# Patient Record
Sex: Female | Born: 1995 | Race: Black or African American | Hispanic: No | Marital: Single | Smoking: Never smoker
Health system: Southern US, Community
[De-identification: ages and names within clinical notes are randomized; demographics above are authoritative.]

## PROBLEM LIST (undated history)

## (undated) DIAGNOSIS — J45909 Unspecified asthma, uncomplicated: Secondary | ICD-10-CM

## (undated) DIAGNOSIS — J302 Other seasonal allergic rhinitis: Secondary | ICD-10-CM

## (undated) DIAGNOSIS — Z9889 Other specified postprocedural states: Secondary | ICD-10-CM

## (undated) HISTORY — PX: WISDOM TOOTH EXTRACTION: SHX21

---
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
## 2017-01-31 ENCOUNTER — Ambulatory Visit (INDEPENDENT_AMBULATORY_CARE_PROVIDER_SITE_OTHER): Payer: Commercial Managed Care - PPO | Admitting: Orthopedic Surgery

## 2017-01-31 ENCOUNTER — Encounter (INDEPENDENT_AMBULATORY_CARE_PROVIDER_SITE_OTHER): Payer: Self-pay | Admitting: Orthopedic Surgery

## 2017-01-31 DIAGNOSIS — M25571 Pain in right ankle and joints of right foot: Secondary | ICD-10-CM | POA: Diagnosis not present

## 2017-01-31 MED ORDER — DICLOFENAC SODIUM 2 % TD SOLN: 2.0000 | Freq: Two times a day (BID) | TRANSDERMAL | 1 refills | Status: DC | PRN

## 2017-01-31 NOTE — Progress Notes (Signed)
Office Visit Note   Patient: Abigail Wolfe           Date of Birth: March 20, 1995           MRN: [id number] Visit Date: 01/31/2017 Requested by: Kelton Pillar, MD [facility] [employer] [address] [place], [place] [address] PCP: Kelton Pillar, MD  Subjective: Chief Complaint  Patient presents with  . Right Foot - Pain    HPI: Patient is a 21 year old with right posterior ankle pain.  4 months ago she had superficial peroneal nerve decompression and she is doing well with that.  The sensation is gradually returning to the dorsal aspect of her foot.  No she describes 6 week history of posterior ankle pain.  No definite history of injury.  She feels like there is a pulling sensation in the tendons.  This is a different pain than she had prior to the ankle surgery.  She takes occasional Advil for the problem.              ROS: All systems reviewed are negative as they relate to the chief complaint within the history of present illness.  Patient denies  fevers or chills.   Assessment & Plan: Visit Diagnoses:  1. Pain in right ankle and joints of right foot     Plan: Impression is soft tissue mediated inflammatory pain posterior lateral right ankle.  Plan is topical anti-inflammatory for 6 weeks.  Could consider further imaging if needed.  Reviewed the MRI scan from May and that was normal of anatomic structures in that area.  Follow-Up Instructions: Return if symptoms worsen or fail to improve.   Orders:  No orders of the defined types were placed in this encounter.  Meds ordered this encounter  Medications  . Diclofenac Sodium (PENNSAID) 2 % SOLN    Sig: Place 2 Squirts onto the skin 2 (two) times daily as needed.    Dispense:  1 Bottle    Refill:  1      Procedures: No procedures performed   Clinical Data: No additional findings.  Objective: Vital Signs: There were no vitals taken for this visit.  Physical Exam:   Constitutional: Patient appears well-developed HEENT:    Head: Normocephalic Eyes:EOM are normal Neck: Normal range of motion Cardiovascular: Normal rate Pulmonary/chest: Effort normal Neurologic: Patient is alert Skin: Skin is warm Psychiatric: Patient has normal mood and affect    Ortho Exam: Orthopedic exam demonstrates normal gait alignment well-healed surgical incision over the lateral aspect of the lower left leg.  Negative Tinel's in this region.  She has good ankle dorsi and plantar flexion inversion and eversion strength.  Most of her tenderness seems to localize in the posterior lateral aspect of the ankle just below the hind the perineal tendons.  No discrete tenderness over the lateral malleolus or the base of the fifth metatarsal.  No skin color differences or temperature changes in the right foot versus left  Specialty Comments:  No specialty comments available.  Imaging: No results found.   PMFS History: Patient Active Problem List   Diagnosis Date Noted  . Acute pain of left knee 03/02/2016   Past Medical History:  Diagnosis Date  . Asthma   . Seasonal allergies     History reviewed. No pertinent family history.  Past Surgical History:  Procedure Laterality Date  . WISDOM TOOTH EXTRACTION     Social History   Occupational History  . Not on file  Tobacco Use  . Smoking  status: Never Smoker  . Smokeless tobacco: Never Used  Substance and Sexual Activity  . Alcohol use: No  . Drug use: No  . Sexual activity: Not on file

## 2017-03-16 DIAGNOSIS — J029 Acute pharyngitis, unspecified: Secondary | ICD-10-CM | POA: Diagnosis not present

## 2017-08-09 DIAGNOSIS — N926 Irregular menstruation, unspecified: Secondary | ICD-10-CM | POA: Diagnosis not present

## 2017-08-09 DIAGNOSIS — Z01419 Encounter for gynecological examination (general) (routine) without abnormal findings: Secondary | ICD-10-CM | POA: Diagnosis not present

## 2017-08-09 DIAGNOSIS — Z3040 Encounter for surveillance of contraceptives, unspecified: Secondary | ICD-10-CM | POA: Diagnosis not present

## 2017-09-09 IMAGING — MR MR KNEE*L* W/O CM
6 series · 36 of 40 positions shown · non-contrast
Comparison: None.

CLINICAL DATA: Left knee pain since a fall while playing volleyball
in January 2016.

EXAM:
MRI OF THE LEFT KNEE WITHOUT CONTRAST
TECHNIQUE: Multiplanar, multisequence MR imaging of the knee was performed. No
intravenous contrast was administered.

[Series 6: PD fat-sat · axial · left · 3.0mm · 0.39mm/px · z∈[-54,+65]mm · 8 of 36 slices shown (1 of 3)]
[im 1/36]
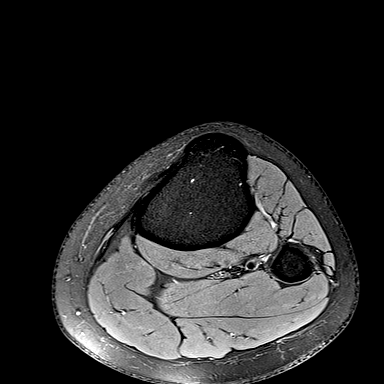
[im 4/36]
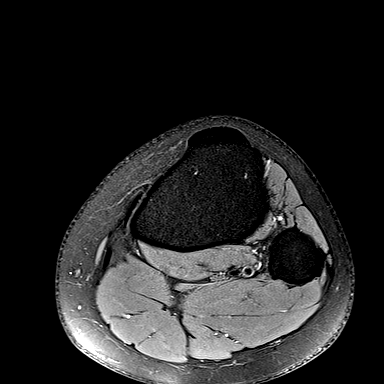
[im 12/36]
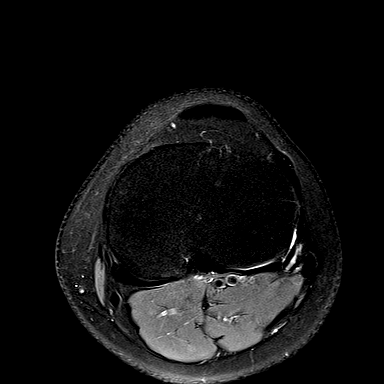
[im 16/36]
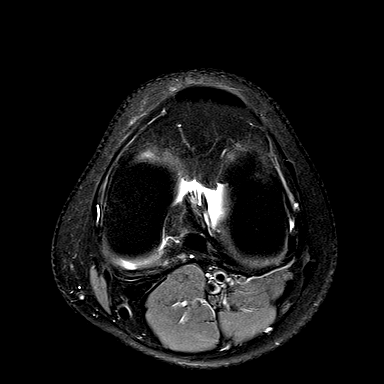
[im 20/36]
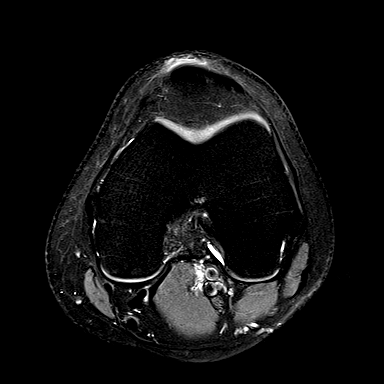
[im 24/36]
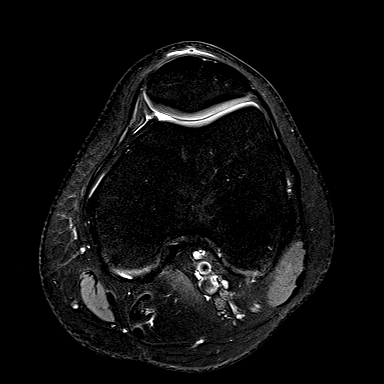
[im 32/36]
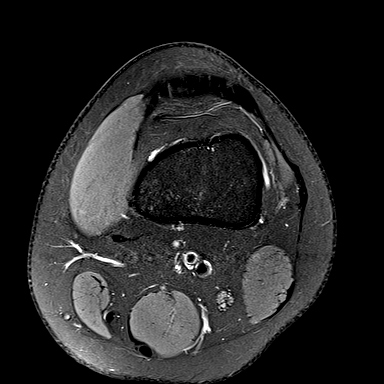
[im 36/36]
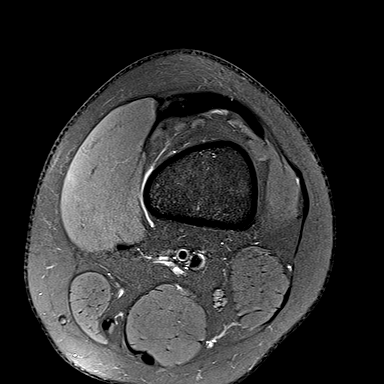

[Series 7: PD fat-sat · coronal · left · 3.5mm · 0.39mm/px · 6 of 25 slices shown (2 of 3)]
[im 1/25]
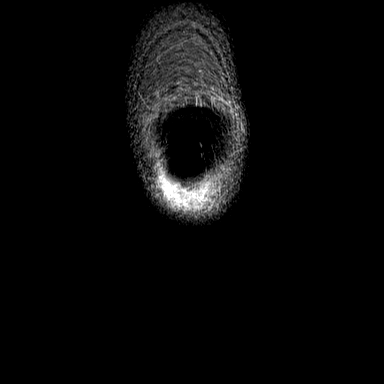
[im 5/25]
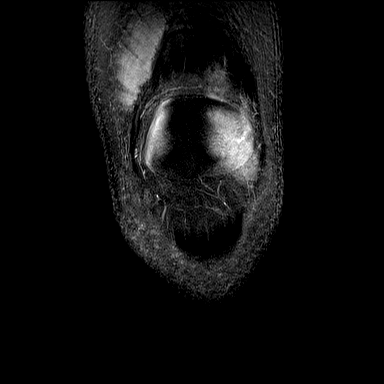
[im 10/25]
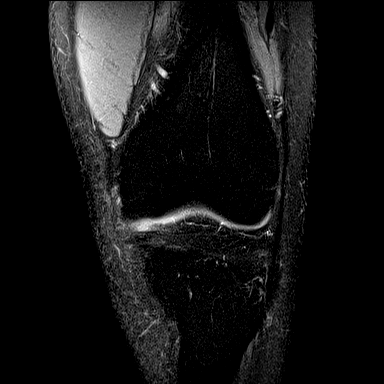
[im 15/25]
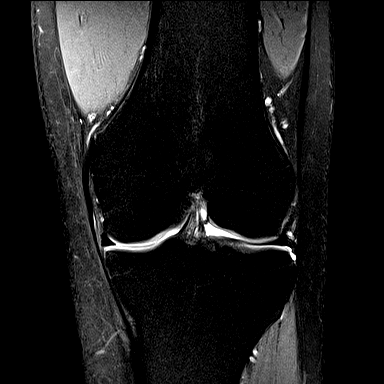
[im 20/25]
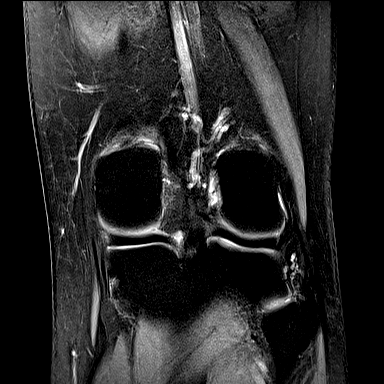
[im 25/25]
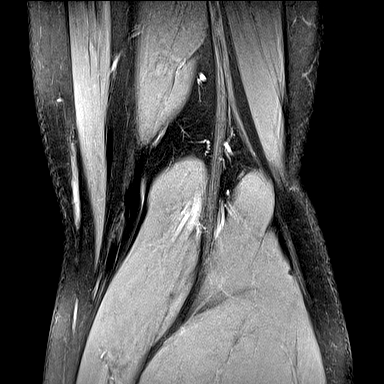

[Series 8: t1_tse_cor · coronal · left · 3.5mm · 0.39mm/px · 4 of 25 slices shown]
[im 1/25]
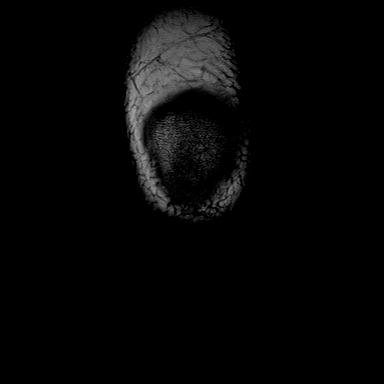
[im 5/25]
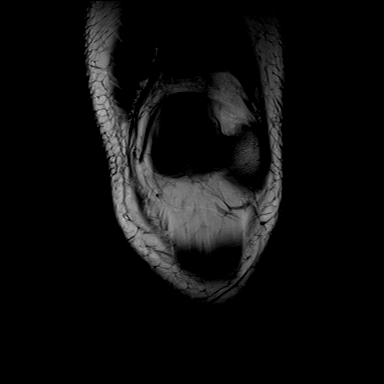
[im 10/25]
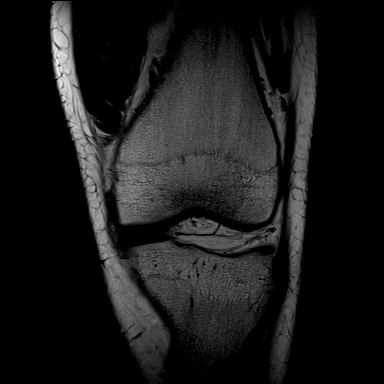
[im 15/25]
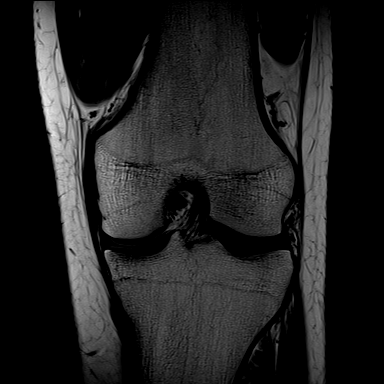

[Series 9: PD fat-sat · sagittal · left · 3.0mm · 0.39mm/px · 7 of 30 slices shown (3 of 3)]
[im 1/30]
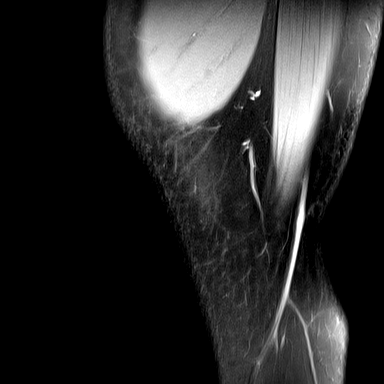
[im 5/30]
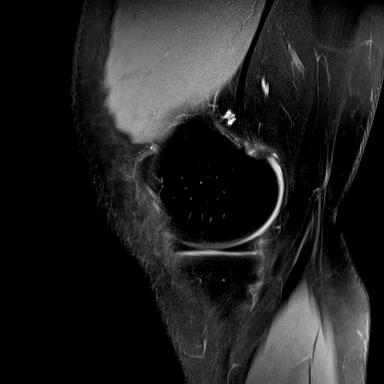
[im 10/30]
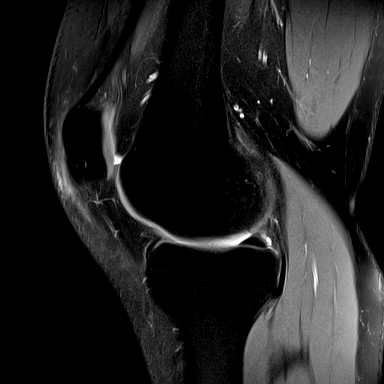
[im 15/30]
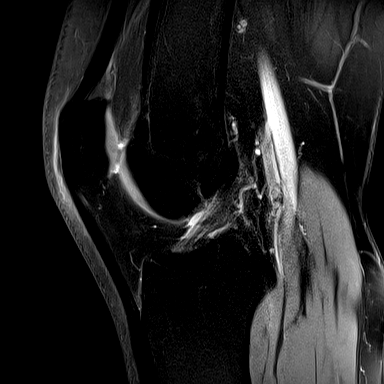
[im 20/30]
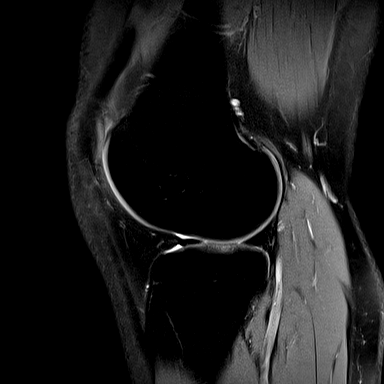
[im 25/30]
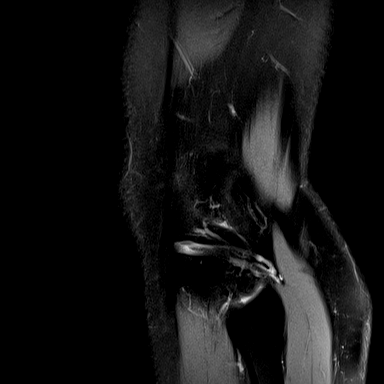
[im 30/30]
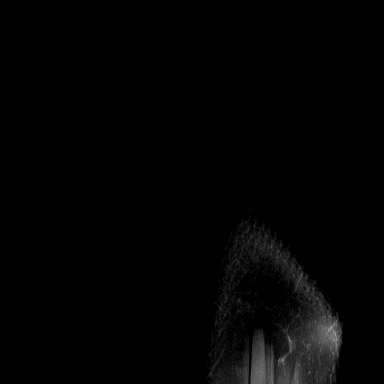

[Series 10: T2 fat-sat · coronal · left · 3.5mm · 0.39mm/px · 6 of 25 slices shown]
[im 1/25]
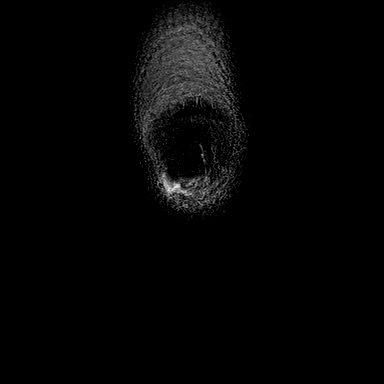
[im 5/25]
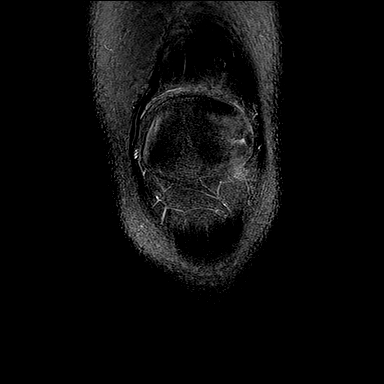
[im 10/25]
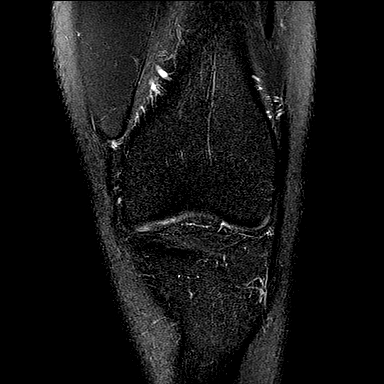
[im 15/25]
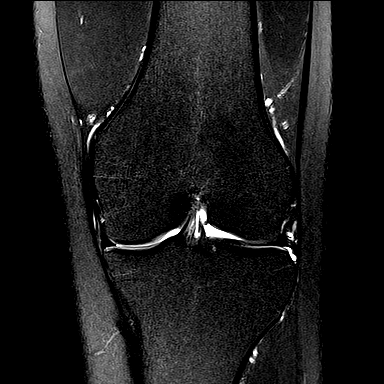
[im 20/25]
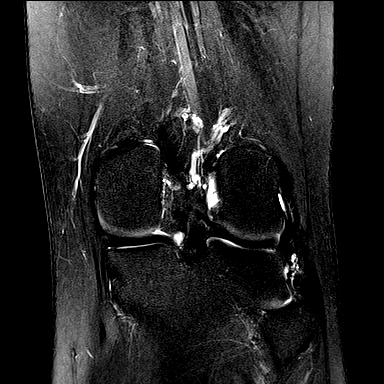
[im 25/25]
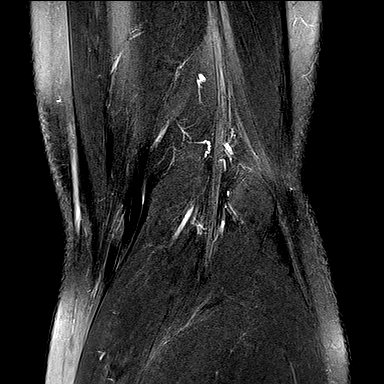

[Series 11: PD · coronal · left · 1.5mm · 0.44mm/px · 5 of 21 slices shown]
[im 1/21]
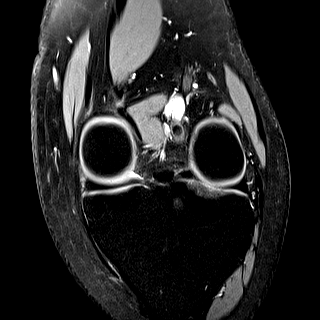
[im 6/21]
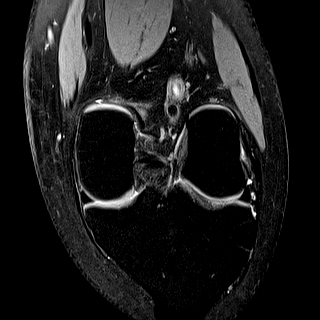
[im 11/21]
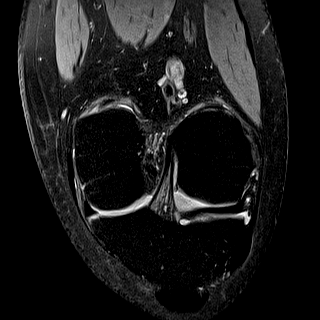
[im 16/21]
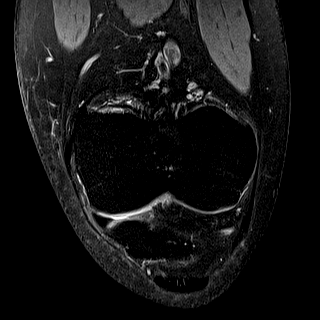
[im 21/21]
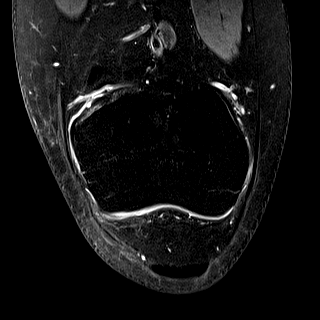

[36 of 40 positions shown; findings below may reference images not displayed]

FINDINGS: There is focal slight inflammation of the prepatellar bursa.

MENISCI

Medial meniscus:  Normal.

Lateral meniscus:  Normal.

LIGAMENTS

Cruciates:  Normal.

Collaterals:  Normal.

CARTILAGE

Patellofemoral:  Normal.

Medial:  Normal.

Lateral:  Normal.

Joint:  Normal.

Popliteal Fossa:  Normal.

Extensor Mechanism:  Normal.

Bones:  Normal.
IMPRESSION: Slight inflammation of the prepatellar bursa. Otherwise, normal
exam.

## 2017-10-25 DIAGNOSIS — J45909 Unspecified asthma, uncomplicated: Secondary | ICD-10-CM | POA: Diagnosis not present

## 2017-10-25 DIAGNOSIS — Z0000 Encounter for general adult medical examination without abnormal findings: Secondary | ICD-10-CM | POA: Diagnosis not present

## 2017-11-07 DIAGNOSIS — R509 Fever, unspecified: Secondary | ICD-10-CM | POA: Diagnosis not present

## 2017-11-07 DIAGNOSIS — N179 Acute kidney failure, unspecified: Secondary | ICD-10-CM | POA: Diagnosis not present

## 2017-11-07 DIAGNOSIS — E876 Hypokalemia: Secondary | ICD-10-CM | POA: Diagnosis not present

## 2017-11-07 DIAGNOSIS — N2881 Hypertrophy of kidney: Secondary | ICD-10-CM | POA: Diagnosis not present

## 2017-11-07 DIAGNOSIS — N10 Acute tubulo-interstitial nephritis: Secondary | ICD-10-CM | POA: Diagnosis not present

## 2017-11-07 DIAGNOSIS — R1030 Lower abdominal pain, unspecified: Secondary | ICD-10-CM | POA: Diagnosis not present

## 2017-11-07 DIAGNOSIS — N12 Tubulo-interstitial nephritis, not specified as acute or chronic: Secondary | ICD-10-CM | POA: Diagnosis not present

## 2017-11-07 DIAGNOSIS — A419 Sepsis, unspecified organism: Secondary | ICD-10-CM | POA: Diagnosis not present

## 2017-12-11 DIAGNOSIS — N119 Chronic tubulo-interstitial nephritis, unspecified: Secondary | ICD-10-CM | POA: Diagnosis not present

## 2018-02-14 DIAGNOSIS — N12 Tubulo-interstitial nephritis, not specified as acute or chronic: Secondary | ICD-10-CM | POA: Diagnosis not present

## 2018-02-14 DIAGNOSIS — N119 Chronic tubulo-interstitial nephritis, unspecified: Secondary | ICD-10-CM | POA: Diagnosis not present

## 2018-02-26 DIAGNOSIS — N119 Chronic tubulo-interstitial nephritis, unspecified: Secondary | ICD-10-CM | POA: Diagnosis not present

## 2018-03-04 DIAGNOSIS — J019 Acute sinusitis, unspecified: Secondary | ICD-10-CM | POA: Diagnosis not present

## 2018-03-04 DIAGNOSIS — Z23 Encounter for immunization: Secondary | ICD-10-CM | POA: Diagnosis not present

## 2021-11-09 ENCOUNTER — Encounter (HOSPITAL_BASED_OUTPATIENT_CLINIC_OR_DEPARTMENT_OTHER): Payer: Self-pay | Admitting: General Surgery

## 2021-11-09 ENCOUNTER — Other Ambulatory Visit: Payer: Self-pay

## 2021-11-15 ENCOUNTER — Other Ambulatory Visit: Payer: Self-pay | Admitting: General Surgery

## 2021-11-18 ENCOUNTER — Encounter (HOSPITAL_BASED_OUTPATIENT_CLINIC_OR_DEPARTMENT_OTHER)
Admission: RE | Disposition: A | Discharge status: Home / Self Care | Payer: Self-pay | Source: Home / Self Care | Attending: General Surgery

## 2021-11-18 ENCOUNTER — Ambulatory Visit (HOSPITAL_BASED_OUTPATIENT_CLINIC_OR_DEPARTMENT_OTHER): Payer: Commercial Managed Care - PPO | Admitting: Anesthesiology

## 2021-11-18 ENCOUNTER — Other Ambulatory Visit: Payer: Self-pay

## 2021-11-18 ENCOUNTER — Ambulatory Visit (HOSPITAL_BASED_OUTPATIENT_CLINIC_OR_DEPARTMENT_OTHER)
Admission: RE | Admit: 2021-11-18 | Discharge: 2021-11-18 | Disposition: A | Discharge status: Home / Self Care | Payer: Commercial Managed Care - PPO | Attending: General Surgery | Admitting: General Surgery

## 2021-11-18 ENCOUNTER — Encounter (HOSPITAL_BASED_OUTPATIENT_CLINIC_OR_DEPARTMENT_OTHER): Payer: Self-pay | Admitting: General Surgery

## 2021-11-18 DIAGNOSIS — R222 Localized swelling, mass and lump, trunk: Secondary | ICD-10-CM | POA: Diagnosis present

## 2021-11-18 DIAGNOSIS — D171 Benign lipomatous neoplasm of skin and subcutaneous tissue of trunk: Secondary | ICD-10-CM | POA: Diagnosis not present

## 2021-11-18 HISTORY — PX: LIPOMA EXCISION: SHX5283

## 2021-11-18 HISTORY — DX: Other specified postprocedural states: Z98.890

## 2021-11-18 LAB — POCT PREGNANCY, URINE: Preg Test, Ur: NEGATIVE

## 2021-11-18 SURGERY — EXCISION LIPOMA
Anesthesia: General | Site: Back | Laterality: Left

## 2021-11-18 MED ORDER — DEXAMETHASONE SODIUM PHOSPHATE 10 MG/ML IJ SOLN
INTRAMUSCULAR | Status: AC
  Filled 2021-11-18: qty 1

## 2021-11-18 MED ORDER — LIDOCAINE 2% (20 MG/ML) 5 ML SYRINGE
INTRAMUSCULAR | Status: AC
  Filled 2021-11-18: qty 5

## 2021-11-18 MED ORDER — ACETAMINOPHEN 500 MG PO TABS
1000.0000 mg | ORAL_TABLET | ORAL | Status: AC
  Administered 2021-11-18: 1000 mg via ORAL

## 2021-11-18 MED ORDER — CEFAZOLIN SODIUM-DEXTROSE 2-4 GM/100ML-% IV SOLN
2.0000 g | INTRAVENOUS | Status: AC
  Administered 2021-11-18: 2 g via INTRAVENOUS

## 2021-11-18 MED ORDER — CHLORHEXIDINE GLUCONATE CLOTH 2 % EX PADS: 6.0000 | MEDICATED_PAD | Freq: Once | CUTANEOUS | Status: DC

## 2021-11-18 MED ORDER — FENTANYL CITRATE (PF) 100 MCG/2ML IJ SOLN
INTRAMUSCULAR | Status: AC
  Filled 2021-11-18: qty 2

## 2021-11-18 MED ORDER — MIDAZOLAM HCL 2 MG/2ML IJ SOLN
INTRAMUSCULAR | Status: AC
  Filled 2021-11-18: qty 2

## 2021-11-18 MED ORDER — CEFAZOLIN SODIUM-DEXTROSE 2-4 GM/100ML-% IV SOLN
INTRAVENOUS | Status: AC
  Filled 2021-11-18: qty 100

## 2021-11-18 MED ORDER — ONDANSETRON HCL 4 MG/2ML IJ SOLN
INTRAMUSCULAR | Status: AC
  Filled 2021-11-18: qty 2

## 2021-11-18 MED ORDER — LACTATED RINGERS IV SOLN: INTRAVENOUS | Status: DC

## 2021-11-18 MED ORDER — ONDANSETRON HCL 4 MG/2ML IJ SOLN
INTRAMUSCULAR | Status: DC | PRN
  Administered 2021-11-18: 4 mg via INTRAVENOUS

## 2021-11-18 MED ORDER — MIDAZOLAM HCL 5 MG/5ML IJ SOLN
INTRAMUSCULAR | Status: DC | PRN
  Administered 2021-11-18: 2 mg via INTRAVENOUS

## 2021-11-18 MED ORDER — OXYCODONE HCL 5 MG PO TABS
5.0000 mg | ORAL_TABLET | Freq: Once | ORAL | Status: AC | PRN
  Administered 2021-11-18: 5 mg via ORAL

## 2021-11-18 MED ORDER — DEXAMETHASONE SODIUM PHOSPHATE 4 MG/ML IJ SOLN
INTRAMUSCULAR | Status: DC | PRN
  Administered 2021-11-18: 10 mg via INTRAVENOUS

## 2021-11-18 MED ORDER — PROPOFOL 10 MG/ML IV BOLUS
INTRAVENOUS | Status: DC | PRN
  Administered 2021-11-18: 150 mg via INTRAVENOUS

## 2021-11-18 MED ORDER — BUPIVACAINE HCL (PF) 0.25 % IJ SOLN
INTRAMUSCULAR | Status: DC | PRN
  Administered 2021-11-18: 20 mL

## 2021-11-18 MED ORDER — PROPOFOL 500 MG/50ML IV EMUL
INTRAVENOUS | Status: AC
  Filled 2021-11-18: qty 50

## 2021-11-18 MED ORDER — PHENYLEPHRINE HCL (PRESSORS) 10 MG/ML IV SOLN
INTRAVENOUS | Status: DC | PRN
  Administered 2021-11-18: 80 ug via INTRAVENOUS

## 2021-11-18 MED ORDER — BUPIVACAINE HCL (PF) 0.25 % IJ SOLN
INTRAMUSCULAR | Status: AC
  Filled 2021-11-18: qty 30

## 2021-11-18 MED ORDER — LIDOCAINE HCL (CARDIAC) PF 100 MG/5ML IV SOSY
PREFILLED_SYRINGE | INTRAVENOUS | Status: DC | PRN
  Administered 2021-11-18: 60 mg via INTRAVENOUS

## 2021-11-18 MED ORDER — ACETAMINOPHEN 500 MG PO TABS
ORAL_TABLET | ORAL | Status: AC
  Filled 2021-11-18: qty 2

## 2021-11-18 MED ORDER — FENTANYL CITRATE (PF) 100 MCG/2ML IJ SOLN
INTRAMUSCULAR | Status: DC | PRN
Start: 2021-11-18 — End: 2021-11-18
  Administered 2021-11-18: 100 ug via INTRAVENOUS

## 2021-11-18 MED ORDER — ENSURE PRE-SURGERY PO LIQD: 296.0000 mL | Freq: Once | ORAL | Status: DC

## 2021-11-18 MED ORDER — ONDANSETRON HCL 4 MG/2ML IJ SOLN: 4.0000 mg | Freq: Four times a day (QID) | INTRAMUSCULAR | Status: DC | PRN

## 2021-11-18 MED ORDER — ROCURONIUM BROMIDE 100 MG/10ML IV SOLN
INTRAVENOUS | Status: DC | PRN
  Administered 2021-11-18: 50 mg via INTRAVENOUS

## 2021-11-18 MED ORDER — OXYCODONE HCL 5 MG/5ML PO SOLN: 5.0000 mg | Freq: Once | ORAL | Status: AC | PRN

## 2021-11-18 MED ORDER — FENTANYL CITRATE (PF) 100 MCG/2ML IJ SOLN: 25.0000 ug | INTRAMUSCULAR | Status: DC | PRN

## 2021-11-18 MED ORDER — OXYCODONE HCL 5 MG PO TABS
ORAL_TABLET | ORAL | Status: AC
  Filled 2021-11-18: qty 1

## 2021-11-18 MED ORDER — SUGAMMADEX SODIUM 200 MG/2ML IV SOLN
INTRAVENOUS | Status: DC | PRN
  Administered 2021-11-18: 200 mg via INTRAVENOUS

## 2021-11-18 SURGICAL SUPPLY — 48 items
ADH SKN CLS APL DERMABOND .7 (GAUZE/BANDAGES/DRESSINGS) ×1
APL PRP STRL LF DISP 70% ISPRP (MISCELLANEOUS) ×1
BLADE CLIPPER SURG (BLADE) IMPLANT
BLADE SURG 15 STRL LF DISP TIS (BLADE) ×1 IMPLANT
BLADE SURG 15 STRL SS (BLADE) ×1
CANISTER SUCT 1200ML W/VALVE (MISCELLANEOUS) IMPLANT
CHLORAPREP W/TINT 26 (MISCELLANEOUS) ×1 IMPLANT
COVER BACK TABLE 60X90IN (DRAPES) ×1 IMPLANT
COVER MAYO STAND STRL (DRAPES) ×1 IMPLANT
DERMABOND ADVANCED (GAUZE/BANDAGES/DRESSINGS) ×1
DERMABOND ADVANCED .7 DNX12 (GAUZE/BANDAGES/DRESSINGS) ×1 IMPLANT
DRAPE LAPAROTOMY 100X72 PEDS (DRAPES) ×1 IMPLANT
DRSG TEGADERM 4X4.75 (GAUZE/BANDAGES/DRESSINGS) IMPLANT
ELECT COATED BLADE 2.86 ST (ELECTRODE) IMPLANT
ELECT REM PT RETURN 9FT ADLT (ELECTROSURGICAL) ×1
ELECTRODE REM PT RTRN 9FT ADLT (ELECTROSURGICAL) ×1 IMPLANT
GAUZE PACKING IODOFORM 1/4X15 (PACKING) IMPLANT
GAUZE SPONGE 4X4 12PLY STRL LF (GAUZE/BANDAGES/DRESSINGS) IMPLANT
GLOVE BIO SURGEON STRL SZ7 (GLOVE) ×1 IMPLANT
GLOVE BIOGEL PI IND STRL 7.0 (GLOVE) IMPLANT
GLOVE BIOGEL PI IND STRL 7.5 (GLOVE) ×1 IMPLANT
GLOVE ECLIPSE 6.5 STRL STRAW (GLOVE) IMPLANT
GOWN STRL REUS W/ TWL LRG LVL3 (GOWN DISPOSABLE) ×3 IMPLANT
GOWN STRL REUS W/TWL LRG LVL3 (GOWN DISPOSABLE) ×2
HEMOSTAT ARISTA ABSORB 3G PWDR (HEMOSTASIS) IMPLANT
NDL HYPO 25X1 1.5 SAFETY (NEEDLE) ×1 IMPLANT
NEEDLE HYPO 25X1 1.5 SAFETY (NEEDLE) ×1 IMPLANT
NS IRRIG 1000ML POUR BTL (IV SOLUTION) IMPLANT
PACK BASIN DAY SURGERY FS (CUSTOM PROCEDURE TRAY) ×1 IMPLANT
PENCIL SMOKE EVACUATOR (MISCELLANEOUS) ×1 IMPLANT
SPIKE FLUID TRANSFER (MISCELLANEOUS) IMPLANT
STRIP CLOSURE SKIN 1/2X4 (GAUZE/BANDAGES/DRESSINGS) IMPLANT
SUT ETHILON 2 0 FS 18 (SUTURE) IMPLANT
SUT MNCRL AB 4-0 PS2 18 (SUTURE) ×1 IMPLANT
SUT SILK 2 0 SH (SUTURE) IMPLANT
SUT VIC AB 2-0 SH 27 (SUTURE)
SUT VIC AB 2-0 SH 27XBRD (SUTURE) IMPLANT
SUT VIC AB 3-0 SH 27 (SUTURE) ×1
SUT VIC AB 3-0 SH 27X BRD (SUTURE) IMPLANT
SUT VICRYL 3-0 CR8 SH (SUTURE) IMPLANT
SUT VICRYL 4-0 PS2 18IN ABS (SUTURE) IMPLANT
SWAB COLLECTION DEVICE MRSA (MISCELLANEOUS) IMPLANT
SWAB CULTURE ESWAB REG 1ML (MISCELLANEOUS) IMPLANT
SYR CONTROL 10ML LL (SYRINGE) ×1 IMPLANT
TOWEL GREEN STERILE FF (TOWEL DISPOSABLE) ×1 IMPLANT
TUBE CONNECTING 20X1/4 (TUBING) IMPLANT
UNDERPAD 30X36 HEAVY ABSORB (UNDERPADS AND DIAPERS) IMPLANT
YANKAUER SUCT BULB TIP NO VENT (SUCTIONS) IMPLANT

## 2021-11-18 NOTE — H&P (Signed)
  26 year old female who I know she and her mom from school. Since 2015 that she has had a mass in her left upper back. This has been monitored. This has been asymptomatic still but it is increased in size quite a bit. Her primary care physician saw this and said due to increase in size I think it be reasonable to consider removing this. She presents today with her mom to discuss options.  Review of Systems: A complete review of systems was obtained from the patient. I have reviewed this information and discussed as appropriate with the patient. See HPI as well for other ROS.  Review of Systems  All other systems reviewed and are negative.   Medical History: Past Medical History:  Diagnosis Date  Asthma, unspecified asthma severity, unspecified whether complicated, unspecified whether persistent   There is no problem list on file for this patient.  Past Surgical History:  Procedure Laterality Date  peroneal nerve ankle surgery  UMBILICAL HERNIA REPAIR    Allergies  Allergen Reactions  Silver-Hydrocolloid Dressing Other (See Comments)   Current Outpatient Medications on File Prior to Visit  Medication Sig Dispense Refill  meloxicam (MOBIC) 7.5 MG tablet Take 7.5 mg by mouth 2 (two) times daily with meals  VIENVA 0.1-20 mg-mcg tablet Take 1 tablet by mouth once daily  albuterol (PROAIR RESPICLICK) 90 mcg/actuation inhaler   No current facility-administered medications on file prior to visit.   Family History  Problem Relation Age of Onset  Hyperlipidemia (Elevated cholesterol) Mother  High blood pressure (Hypertension) Father  Hyperlipidemia (Elevated cholesterol) Father    Social History   Tobacco Use  Smoking Status Never  Smokeless Tobacco Not on file    Social History   Socioeconomic History  Marital status: Single  Tobacco Use  Smoking status: Never  Substance and Sexual Activity  Alcohol use: Not Currently  Drug use: Never   Objective:   Vitals:   10/31/21 1153  BP: 132/80  Pulse: 85  Temp: 37.1 C (98.7 F)  Weight: 79.6 kg (175 lb 6.4 oz)  Height: 170.2 cm ('5\' 7"'$ )   Body mass index is 27.47 kg/m.  Physical Exam Constitutional:  Appearance: Normal appearance.  Musculoskeletal:  Arms:  Comments: 6 x 6 cm left upper back mass that is mobile and nontender consistent with a likely lipoma  Neurological:  Mental Status: She is alert.    Assessment and Plan:   Diagnoses and all orders for this visit:  Lipoma of back   Excision of back mass  Due to its increase in size and my exam I think it would be very reasonable to excise this. We discussed doing this under general anesthesia. We discussed risks, recovery associated with this. Plan to proceed as soon as possible.

## 2021-11-18 NOTE — Anesthesia Preprocedure Evaluation (Signed)
Anesthesia Evaluation  Patient identified by MRN, date of birth, ID band Patient awake    Reviewed: Allergy & Precautions, H&P , NPO status , Patient's Chart, lab work & pertinent test results  History of Anesthesia Complications (+) PONV and history of anesthetic complications  Airway Mallampati: II   Neck ROM: full    Dental   Pulmonary asthma ,    breath sounds clear to auscultation       Cardiovascular negative cardio ROS   Rhythm:regular Rate:Normal     Neuro/Psych    GI/Hepatic   Endo/Other    Renal/GU      Musculoskeletal   Abdominal   Peds  Hematology   Anesthesia Other Findings   Reproductive/Obstetrics                             Anesthesia Physical Anesthesia Plan  ASA: 2  Anesthesia Plan: General   Post-op Pain Management:    Induction: Intravenous  PONV Risk Score and Plan: 4 or greater and Ondansetron, Dexamethasone, Midazolam and Treatment may vary due to age or medical condition  Airway Management Planned: Oral ETT  Additional Equipment:   Intra-op Plan:   Post-operative Plan: Extubation in OR  Informed Consent: I have reviewed the patients History and Physical, chart, labs and discussed the procedure including the risks, benefits and alternatives for the proposed anesthesia with the patient or authorized representative who has indicated his/her understanding and acceptance.     Dental advisory given  Plan Discussed with: CRNA, Anesthesiologist and Surgeon  Anesthesia Plan Comments:         Anesthesia Quick Evaluation

## 2021-11-18 NOTE — Transfer of Care (Signed)
Immediate Anesthesia Transfer of Care Note  Patient: [place]  Procedure(s) Performed: EXCISION SUBCUTANEOUS UPPER BACK LIPOMA (Left: Back)  Patient Location: PACU  Anesthesia Type:General  Level of Consciousness: sedated  Airway & Oxygen Therapy: Patient Spontanous Breathing and Patient connected to face mask oxygen  Post-op Assessment: Report given to RN and Post -op Vital signs reviewed and stable  Post vital signs: Reviewed and stable  Last Vitals:  Vitals Value Taken Time  BP 135/92 11/18/21 0830  Temp    Pulse 98 11/18/21 0830  Resp 20 11/18/21 0830  SpO2 100 % 11/18/21 0830  Vitals shown include unvalidated device data.  Last Pain:  Vitals:   11/18/21 9923  TempSrc: Oral  PainSc: 0-No pain         Complications: No notable events documented.

## 2021-11-18 NOTE — Anesthesia Procedure Notes (Signed)
Procedure Name: Intubation Date/Time: 11/18/2021 7:32 AM  Performed by: Maryella Shivers, CRNAPre-anesthesia Checklist: Patient identified, Emergency Drugs available, Suction available and Patient being monitored Patient Re-evaluated:Patient Re-evaluated prior to induction Oxygen Delivery Method: Circle system utilized Preoxygenation: Pre-oxygenation with 100% oxygen Induction Type: IV induction Ventilation: Mask ventilation without difficulty Laryngoscope Size: Mac and 4 Grade View: Grade I Tube type: Oral Tube size: 7.0 mm Number of attempts: 1 Airway Equipment and Method: Stylet and Oral airway Placement Confirmation: ETT inserted through vocal cords under direct vision, positive ETCO2 and breath sounds checked- equal and bilateral Secured at: 22 cm Tube secured with: Tape Dental Injury: Teeth and Oropharynx as per pre-operative assessment

## 2021-11-18 NOTE — Interval H&P Note (Signed)
History and Physical Interval Note:  11/18/2021 7:14 AM  Abigail Wolfe  has presented today for surgery, with the diagnosis of BACK MASS.  The various methods of treatment have been discussed with the patient and family. After consideration of risks, benefits and other options for treatment, the patient has consented to  Procedure(s): EXCISION SUBCUTANEOUS UPPER BACK LIPOMA (N/A) as a surgical intervention.  The patient's history has been reviewed, patient examined, no change in status, stable for surgery.  I have reviewed the patient's chart and labs.  Questions were answered to the patient's satisfaction.     Rolm Bookbinder

## 2021-11-18 NOTE — Interval H&P Note (Signed)
History and Physical Interval Note:  11/18/2021 7:25 AM  Abigail Wolfe  has presented today for surgery, with the diagnosis of BACK MASS.  The various methods of treatment have been discussed with the patient and family. After consideration of risks, benefits and other options for treatment, the patient has consented to  Procedure(s): EXCISION SUBCUTANEOUS UPPER BACK LIPOMA (N/A) as a surgical intervention.  The patient's history has been reviewed, patient examined, no change in status, stable for surgery.  I have reviewed the patient's chart and labs.  Questions were answered to the patient's satisfaction.     Rolm Bookbinder

## 2021-11-18 NOTE — Discharge Instructions (Addendum)
[place] Office Phone Number [phone]  POST OP INSTRUCTIONS Take 400 mg of ibuprofen every 8 hours or 650 mg tylenol every 6 hours for next 72 hours then as needed. Use ice several times daily also.  Take your usually prescribed medications unless otherwise directed Resume normal diet as tolerated Most patients will experience some swelling and bruising. Ice packs will help.  Swelling and bruising can take several days to resolve. If the swelling continues to increase please call It is common to experience some constipation if taking pain medication after surgery.  Increasing fluid intake and taking a stool softener will usually help or prevent this problem from occurring.  A mild laxative (Milk of Magnesia or Miralax) should be taken according to package directions if there are no bowel movements after 48 hours. I used skin glue on the incision, you may shower in 24 hours.  The glue and tape strips will flake off over the next 2-3 weeks.  ACTIVITIES:  You may resume regular daily activities (gradually increasing) beginning the next day.  Wearing a good support bra or sports bra minimizes pain and swelling.   You may drive when you no longer are taking prescription pain medication, you can comfortably wear a seatbelt, and you can safely maneuver your car and apply brakes. RETURN TO WORK:  ______________________________________________________________________________________ Dennis Bast should see your doctor in the office for a follow-up appointment approximately 2-3 weeks after your surgery.  Your doctor's nurse will typically make your follow-up appointment when she calls you with your pathology report.  Expect your pathology report 3-4 business days after your surgery.  You may call to check if you do not hear from [place] after three days. OTHER INSTRUCTIONS: _______________________________________________________________________________________________  _____________________________________________________________________________________________________________________________________ _____________________________________________________________________________________________________________________________________ _____________________________________________________________________________________________________________________________________  WHEN TO CALL DR WAKEFIELD: Fever over 101.0 Nausea and/or vomiting. Extreme swelling or bruising. Continued bleeding from incision. Increased pain, redness, or drainage from the incision.  The clinic staff is available to answer your questions during regular business hours.  Please don't hesitate to call and ask to speak to one of the nurses for clinical concerns.  If you have a medical emergency, go to the nearest emergency room or call 911.  A surgeon from [facility] is always on call at the hospital.  For further questions, please visit [email or web address]   Post Anesthesia Home Care Instructions  Activity: Get plenty of rest for the remainder of the day. A responsible individual must stay with you for 24 hours following the procedure.  For the next 24 hours, DO NOT: -Drive a car -Paediatric nurse -Drink alcoholic beverages -Take any medication unless instructed by your physician -Make any legal decisions or sign important papers.  Meals: Start with liquid foods such as gelatin or soup. Progress to regular foods as tolerated. Avoid greasy, spicy, heavy foods. If nausea and/or vomiting occur, drink only clear liquids until the nausea and/or vomiting subsides. Call your physician if vomiting continues.  Special Instructions/Symptoms: Your throat may feel dry or sore from the anesthesia or the breathing tube placed in your throat during surgery. If this causes discomfort, gargle with warm salt water. The discomfort should disappear within 24 hours.

## 2021-11-18 NOTE — Op Note (Signed)
Preoperative diagnosis: Back mass Postoperative diagnosis: Same as above, clinically appears to be a lipoma Procedure: Excision of 15 cm subcutaneous back mass Surgeon: Dr. Serita Grammes Anesthesia: General Specimens: Lipoma to pathology Complications: None Drains: None Estimated blood loss: Minimal Disposition to recovery stable condition Sponge and needle count was correct at completion  Indications: This a 26 year old female who has an enlarging mass in her left upper back.  This is increased in size quite a bit.  We discussed excision in the operating room.  Procedure: After informed consent was obtained the patient was taken to the operative room.  She was given antibiotics.  SCDs were in place.  She was placed under general anesthesia without complication.  She was prepped and draped in the standard sterile surgical fashion after being placed in the right lateral position and appropriately padded.  Surgical timeout was then performed.  I infiltrated Marcaine around the area.  I then made a transverse incision overlying the mass.  I dissected through the subcutaneous tissue and was able to identify what clinical appeared to be a lipoma.  This was very large and multilobulated.  I was able to remove the entire lipoma all the way down to her fascia.  See the picture below.  There was no other tissue present that was concerning for any additional lipoma.  I then obtained hemostasis.  I did place some Arista in the cavity.  I then closed this down with 3-0 Vicryl suture.  The dermis was closed with 3-0 Vicryl and the skin with 4-0 Monocryl.  Glue and Steri-Strips were applied.  She tolerated this well was extubated and transferred to recovery stable.

## 2021-11-18 NOTE — Anesthesia Postprocedure Evaluation (Signed)
Anesthesia Post Note  Patient: Abigail Wolfe  Procedure(s) Performed: EXCISION SUBCUTANEOUS UPPER BACK LIPOMA (Left: Back)     Patient location during evaluation: PACU Anesthesia Type: General Level of consciousness: awake and alert Pain management: pain level controlled Vital Signs Assessment: post-procedure vital signs reviewed and stable Respiratory status: spontaneous breathing, nonlabored ventilation, respiratory function stable and patient connected to nasal cannula oxygen Cardiovascular status: blood pressure returned to baseline and stable Postop Assessment: no apparent nausea or vomiting Anesthetic complications: no   No notable events documented.  Last Vitals:  Vitals:   11/18/21 0900 11/18/21 0910  BP: 120/80   Pulse: 62 79  Resp: (!) 0 10  Temp:  36.5 C  SpO2: 98%     Last Pain:  Vitals:   11/18/21 0910  TempSrc: Oral  PainSc:                  Orlando S

## 2021-11-21 ENCOUNTER — Encounter (HOSPITAL_BASED_OUTPATIENT_CLINIC_OR_DEPARTMENT_OTHER): Payer: Self-pay | Admitting: General Surgery

## 2021-11-21 LAB — SURGICAL PATHOLOGY

## 2022-01-22 ENCOUNTER — Encounter (HOSPITAL_BASED_OUTPATIENT_CLINIC_OR_DEPARTMENT_OTHER): Payer: Self-pay | Admitting: Pediatrics

## 2022-01-22 ENCOUNTER — Emergency Department (HOSPITAL_BASED_OUTPATIENT_CLINIC_OR_DEPARTMENT_OTHER)
Admission: EM | Admit: 2022-01-22 | Discharge: 2022-01-22 | Disposition: A | Discharge status: Home / Self Care | Payer: Commercial Managed Care - PPO | Attending: Emergency Medicine | Admitting: Emergency Medicine

## 2022-01-22 ENCOUNTER — Other Ambulatory Visit: Payer: Self-pay

## 2022-01-22 ENCOUNTER — Emergency Department (HOSPITAL_BASED_OUTPATIENT_CLINIC_OR_DEPARTMENT_OTHER): Payer: Commercial Managed Care - PPO

## 2022-01-22 DIAGNOSIS — W208XXA Other cause of strike by thrown, projected or falling object, initial encounter: Secondary | ICD-10-CM | POA: Diagnosis not present

## 2022-01-22 DIAGNOSIS — M79671 Pain in right foot: Secondary | ICD-10-CM

## 2022-01-22 DIAGNOSIS — S91311A Laceration without foreign body, right foot, initial encounter: Secondary | ICD-10-CM | POA: Diagnosis present

## 2022-01-22 MED ORDER — LIDOCAINE-EPINEPHRINE (PF) 2 %-1:200000 IJ SOLN
10.0000 mL | Freq: Once | INTRAMUSCULAR | Status: AC
  Administered 2022-01-22: 10 mL
  Filled 2022-01-22: qty 20

## 2022-01-22 MED ORDER — BACITRACIN ZINC 500 UNIT/GM EX OINT
TOPICAL_OINTMENT | Freq: Once | CUTANEOUS | Status: AC
  Filled 2022-01-22: qty 28.35

## 2022-01-22 NOTE — ED Notes (Signed)
ED Provider at bedside for lac repair

## 2022-01-22 NOTE — ED Provider Notes (Signed)
[facility] Provider Note   CSN: [id number] Arrival date & time: 01/22/22  1210     History  Chief Complaint  Patient presents with   Laceration    Abigail Wolfe is a 26 y.o. female with noncontributory past medical history who presents with concern for right foot pain, difficulty walking, and laceration.  Patient reports that it fell off around the top shelf, she has had difficulty walking since then, as well as a laceration that she thinks may be repaired.  Her last tetanus was in 2022.  She denies any other injuries.   Laceration      Home Medications Prior to Admission medications   Medication Sig Start Date End Date Taking? Authorizing Provider  albuterol (PROVENTIL HFA;VENTOLIN HFA) 108 (90 BASE) MCG/ACT inhaler Inhale into the lungs every 6 (six) hours as needed for wheezing or shortness of breath.    [provider]  cetirizine (ZYRTEC) 10 MG chewable tablet Chew 10 mg by mouth daily.    [provider]  ibuprofen (ADVIL,MOTRIN) 600 MG tablet Take 1 tablet (600 mg total) by mouth every 6 (six) hours as needed. 01/31/16   Tomi Likens, PA-C  LESSINA-28 0.1-20 MG-MCG tablet  07/17/16   [provider]  meloxicam (MOBIC) 7.5 MG tablet Take 7.5 mg by mouth daily.    [provider]      Allergies    Aquacel extra hydrofiber [wound dressings]    Review of Systems   Review of Systems  Skin:  Positive for wound.  All other systems reviewed and are negative.   Physical Exam Updated Vital Signs BP (!) 140/104 (BP Location: Right Arm)   Pulse 78   Temp 98.4 F (36.9 C) (Oral)   Resp 18   Ht '5\' 7"'$  (1.702 m)   Wt 71.2 kg   LMP 01/10/2022   SpO2 100%   BMI 24.59 kg/m  Physical Exam Vitals and nursing note reviewed.  Constitutional:      General: She is not in acute distress.    Appearance: Normal appearance.  HENT:     Head: Normocephalic and atraumatic.  Eyes:     General:         Right eye: No discharge.        Left eye: No discharge.  Cardiovascular:     Rate and Rhythm: Normal rate and regular rhythm.  Pulmonary:     Effort: Pulmonary effort is normal. No respiratory distress.  Musculoskeletal:        General: No deformity.     Comments: Tenderness to palpation, soft tissue swelling in the midfoot without obvious step-off or deformity  Skin:    General: Skin is warm and dry.     Comments: Approximately 2 cm laceration on the dorsum of the right foot without active bleeding at this time  Neurological:     Mental Status: She is alert and oriented to person, place, and time.  Psychiatric:        Mood and Affect: Mood normal.        Behavior: Behavior normal.     ED Results / Procedures / Treatments   Labs (all labs ordered are listed, but only abnormal results are displayed) Labs Reviewed - No data to display  EKG None  Radiology DG Foot Complete Right  Result Date: 01/22/2022 CLINICAL DATA:  Laceration to right foot. EXAM: RIGHT FOOT COMPLETE - 3+ VIEW COMPARISON:  07/19/2016, without report FINDINGS: No acute fracture or  dislocation.  No radiopaque foreign object. IMPRESSION: No acute osseous abnormality. Electronically Signed   By: Abigail Miyamoto M.D.   On: 01/22/2022 13:26    Procedures .Marland KitchenLaceration Repair  Date/Time: 01/22/2022 2:27 PM  Performed by: Anselmo Pickler, PA-C Authorized by: Anselmo Pickler, PA-C   Consent:    Consent obtained:  Verbal   Consent given by:  Patient   Risks, benefits, and alternatives were discussed: yes     Risks discussed:  Infection, retained foreign body, tendon damage, vascular damage, poor wound healing, poor cosmetic result, pain, need for additional repair and nerve damage   Alternatives discussed:  No treatment Universal protocol:    Procedure explained and questions answered to patient or proxy's satisfaction: yes     Patient identity confirmed:  Verbally with patient Anesthesia:     Anesthesia method:  Local infiltration   Local anesthetic:  Lidocaine 2% WITH epi Laceration details:    Location:  Foot   Foot location:  Top of L foot   Length (cm):  2   Depth (mm):  4 Treatment:    Area cleansed with:  Povidone-iodine and saline   Amount of cleaning:  Standard Skin repair:    Repair method:  Sutures   Suture size:  4-0   Suture material:  Prolene   Suture technique:  Simple interrupted   Number of sutures:  3 Repair type:    Repair type:  Simple Post-procedure details:    Dressing:  Antibiotic ointment and non-adherent dressing   Procedure completion:  Tolerated     Medications Ordered in ED Medications  lidocaine-EPINEPHrine (XYLOCAINE W/EPI) 2 %-1:200000 (PF) injection 10 mL (10 mLs Infiltration Given 01/22/22 1320)  bacitracin ointment ( Topical Given 01/22/22 1359)    ED Course/ Medical Decision Making/ A&P                           Medical Decision Making Amount and/or Complexity of Data Reviewed Radiology: ordered.  Risk OTC drugs. Prescription drug management.   This is an overall well-appearing 26 year old female who presents with concern for laceration to posterior right buttock, and some pain and difficulty walking on the foot since the jar fell on it last night.  My emergent differential diagnosis includes acute fracture, dislocation, with overlying laceration.  On my exam patient does have around 2 cm laceration on the posterior right foot with no active bleeding at this time.  It appears that it was cleaned very well last night, but we will clean it again in the emergency department to prevent any infection.  It was extensively washed with povidone iodine and saline.  I independently interpreted imaging including plain film x-ray of the right foot which shows acute fracture, dislocation. I agree with the radiologist interpretation.  Laceration repaired as described above, patient tolerated without difficulty.  Encouraged to keep clean,  dry, monitor for signs of infection, patient will return for suture removal in 1 week.  She is discharged in stable condition, given postop shoe to help with pain in the midfoot with ambulation.  Final Clinical Impression(s) / ED Diagnoses Final diagnoses:  Laceration of right foot, initial encounter  Pain of right midfoot    Rx / DC Orders ED Discharge Orders     None         Anselmo Pickler, PA-C 01/22/22 1433    Lennice Sites, DO 01/22/22 1446

## 2022-01-22 NOTE — ED Notes (Signed)
Patient cannot bear weight . Has limited movement Patient has an open wound on rt  foot

## 2022-01-22 NOTE — Discharge Instructions (Signed)
Please use Tylenol or ibuprofen for pain.  You may use 600 mg ibuprofen every 6 hours or 1000 mg of Tylenol every 6 hours.  You may choose to alternate between the 2.  This would be most effective.  Not to exceed 4 g of Tylenol within 24 hours.  Not to exceed 3200 mg ibuprofen 24 hours.  Recommend rest, ice, compression, elevation of the affected extremity, please clean it daily and monitor for signs of infection including worsening pain, redness, swelling, or pus draining from the affected site.  Please return for further evaluation if you are suspicious of developing infection.  You will need to return in 1 week for suture removal as we discussed.

## 2022-01-22 NOTE — ED Triage Notes (Signed)
Laceration on right foot; reported a glass jar fell and broke on her foot; occurred last night around 11pm. TDAP in 2022.
